# Patient Record
Sex: Male | Born: 1980 | Race: Black or African American | Hispanic: No | Marital: Single | Smoking: Current every day smoker
Health system: Southern US, Community
[De-identification: ages and names within clinical notes are randomized; demographics above are authoritative.]

## PROBLEM LIST (undated history)

## (undated) DIAGNOSIS — W3400XA Accidental discharge from unspecified firearms or gun, initial encounter: Secondary | ICD-10-CM

---

## 2019-12-19 ENCOUNTER — Emergency Department (HOSPITAL_COMMUNITY): Payer: Medicaid Other

## 2019-12-19 ENCOUNTER — Observation Stay (HOSPITAL_COMMUNITY): Payer: Medicaid Other

## 2019-12-19 ENCOUNTER — Encounter (HOSPITAL_COMMUNITY): Payer: Self-pay | Admitting: *Deleted

## 2019-12-19 ENCOUNTER — Observation Stay (HOSPITAL_COMMUNITY): Payer: Medicaid Other | Admitting: Certified Registered"

## 2019-12-19 ENCOUNTER — Other Ambulatory Visit: Payer: Self-pay

## 2019-12-19 ENCOUNTER — Encounter (HOSPITAL_COMMUNITY): Admission: EM | Disposition: A | Discharge status: Home / Self Care | Payer: Self-pay | Source: Home / Self Care

## 2019-12-19 ENCOUNTER — Inpatient Hospital Stay (HOSPITAL_COMMUNITY)
Admission: EM | Admit: 2019-12-19 | Discharge: 2019-12-21 | DRG: 481 | Disposition: A | Discharge status: Home / Self Care | Payer: Medicaid Other | Attending: Physician Assistant | Admitting: Physician Assistant

## 2019-12-19 DIAGNOSIS — S0183XA Puncture wound without foreign body of other part of head, initial encounter: Secondary | ICD-10-CM | POA: Diagnosis present

## 2019-12-19 DIAGNOSIS — W3400XA Accidental discharge from unspecified firearms or gun, initial encounter: Secondary | ICD-10-CM

## 2019-12-19 DIAGNOSIS — S62307B Unspecified fracture of fifth metacarpal bone, left hand, initial encounter for open fracture: Secondary | ICD-10-CM | POA: Diagnosis present

## 2019-12-19 DIAGNOSIS — D62 Acute posthemorrhagic anemia: Secondary | ICD-10-CM | POA: Diagnosis present

## 2019-12-19 DIAGNOSIS — T148XXA Other injury of unspecified body region, initial encounter: Secondary | ICD-10-CM

## 2019-12-19 DIAGNOSIS — Z20822 Contact with and (suspected) exposure to covid-19: Secondary | ICD-10-CM | POA: Diagnosis present

## 2019-12-19 DIAGNOSIS — S81041A Puncture wound with foreign body, right knee, initial encounter: Secondary | ICD-10-CM | POA: Diagnosis present

## 2019-12-19 DIAGNOSIS — M62838 Other muscle spasm: Secondary | ICD-10-CM | POA: Diagnosis not present

## 2019-12-19 DIAGNOSIS — S72431B Displaced fracture of medial condyle of right femur, initial encounter for open fracture type I or II: Principal | ICD-10-CM | POA: Diagnosis present

## 2019-12-19 HISTORY — DX: Accidental discharge from unspecified firearms or gun, initial encounter: W34.00XA

## 2019-12-19 HISTORY — PX: ORIF FEMUR FRACTURE: SHX2119

## 2019-12-19 HISTORY — PX: IRRIGATION AND DEBRIDEMENT KNEE: SHX5185

## 2019-12-19 HISTORY — PX: OPEN REDUCTION INTERNAL FIXATION (ORIF) METACARPAL: SHX6234

## 2019-12-19 LAB — CBC
HCT: 42.3 % (ref 39.0–52.0)
Hemoglobin: 13.6 g/dL (ref 13.0–17.0)
MCH: 27.1 pg (ref 26.0–34.0)
MCHC: 32.2 g/dL (ref 30.0–36.0)
MCV: 84.3 fL (ref 80.0–100.0)
Platelets: 188 10*3/uL (ref 150–400)
RBC: 5.02 MIL/uL (ref 4.22–5.81)
RDW: 13.2 % (ref 11.5–15.5)
WBC: 8.6 10*3/uL (ref 4.0–10.5)
nRBC: 0 % (ref 0.0–0.2)

## 2019-12-19 LAB — I-STAT CHEM 8, ED
BUN: 14 mg/dL (ref 6–20)
Calcium, Ion: 1.11 mmol/L — ABNORMAL LOW (ref 1.15–1.40)
Chloride: 103 mmol/L (ref 98–111)
Creatinine, Ser: 1 mg/dL (ref 0.61–1.24)
Glucose, Bld: 156 mg/dL — ABNORMAL HIGH (ref 70–99)
HCT: 40 % (ref 39.0–52.0)
Hemoglobin: 13.6 g/dL (ref 13.0–17.0)
Potassium: 4.1 mmol/L (ref 3.5–5.1)
Sodium: 138 mmol/L (ref 135–145)
TCO2: 27 mmol/L (ref 22–32)

## 2019-12-19 LAB — COMPREHENSIVE METABOLIC PANEL
ALT: 19 U/L (ref 0–44)
AST: 29 U/L (ref 15–41)
Albumin: 3.7 g/dL (ref 3.5–5.0)
Alkaline Phosphatase: 60 U/L (ref 38–126)
Anion gap: 10 (ref 5–15)
BUN: 13 mg/dL (ref 6–20)
CO2: 25 mmol/L (ref 22–32)
Calcium: 8.9 mg/dL (ref 8.9–10.3)
Chloride: 101 mmol/L (ref 98–111)
Creatinine, Ser: 1.11 mg/dL (ref 0.61–1.24)
GFR calc Af Amer: 46 mL/min — ABNORMAL LOW (ref >60)
GFR calc non Af Amer: 40 mL/min — ABNORMAL LOW (ref >60)
Glucose, Bld: 163 mg/dL — ABNORMAL HIGH (ref 70–99)
Potassium: 4 mmol/L (ref 3.5–5.1)
Sodium: 136 mmol/L (ref 135–145)
Total Bilirubin: 0.3 mg/dL (ref 0.3–1.2)
Total Protein: 6.9 g/dL (ref 6.5–8.1)

## 2019-12-19 LAB — SAMPLE TO BLOOD BANK

## 2019-12-19 LAB — ETHANOL: Alcohol, Ethyl (B): <10 mg/dL (ref <10)

## 2019-12-19 LAB — VITAMIN D 25 HYDROXY (VIT D DEFICIENCY, FRACTURES): Vit D, 25-Hydroxy: 13.51 ng/mL — ABNORMAL LOW (ref 30–100)

## 2019-12-19 LAB — LACTIC ACID, PLASMA: Lactic Acid, Venous: 1.9 mmol/L (ref 0.5–1.9)

## 2019-12-19 LAB — RESPIRATORY PANEL BY RT PCR (FLU A&B, COVID)
Influenza A by PCR: NEGATIVE
Influenza B by PCR: NEGATIVE
SARS Coronavirus 2 by RT PCR: NEGATIVE

## 2019-12-19 LAB — PROTIME-INR
INR: 1 (ref 0.8–1.2)
Prothrombin Time: 13 seconds (ref 11.4–15.2)

## 2019-12-19 LAB — CDS SEROLOGY

## 2019-12-19 SURGERY — IRRIGATION AND DEBRIDEMENT KNEE
Anesthesia: General | Site: Knee | Laterality: Right

## 2019-12-19 MED ORDER — METHOCARBAMOL 500 MG PO TABS
500.0000 mg | ORAL_TABLET | Freq: Four times a day (QID) | ORAL | Status: DC | PRN
  Administered 2019-12-19 – 2019-12-21 (×4): 500 mg via ORAL
  Filled 2019-12-19 (×4): qty 1

## 2019-12-19 MED ORDER — SODIUM CHLORIDE 0.9 % IV SOLN: INTRAVENOUS | Status: DC

## 2019-12-19 MED ORDER — OXYCODONE HCL 5 MG PO TABS: 5.0000 mg | ORAL_TABLET | ORAL | Status: DC | PRN

## 2019-12-19 MED ORDER — ACETAMINOPHEN 325 MG PO TABS: 325.0000 mg | ORAL_TABLET | Freq: Once | ORAL | Status: DC | PRN

## 2019-12-19 MED ORDER — OXYCODONE HCL 5 MG PO TABS
10.0000 mg | ORAL_TABLET | ORAL | Status: DC | PRN
  Administered 2019-12-19: 10 mg via ORAL
  Administered 2019-12-21: 15 mg via ORAL
  Filled 2019-12-19: qty 3

## 2019-12-19 MED ORDER — TETANUS-DIPHTH-ACELL PERTUSSIS 5-2.5-18.5 LF-MCG/0.5 IM SUSP
0.5000 mL | Freq: Once | INTRAMUSCULAR | Status: AC
  Administered 2019-12-19: 0.5 mL via INTRAMUSCULAR
  Filled 2019-12-19: qty 0.5

## 2019-12-19 MED ORDER — VANCOMYCIN HCL 1000 MG IV SOLR
INTRAVENOUS | Status: AC
  Filled 2019-12-19: qty 1000

## 2019-12-19 MED ORDER — FENTANYL CITRATE (PF) 250 MCG/5ML IJ SOLN
INTRAMUSCULAR | Status: DC | PRN
  Administered 2019-12-19 (×3): 50 ug via INTRAVENOUS
  Administered 2019-12-19: 25 ug via INTRAVENOUS
  Administered 2019-12-19: 50 ug via INTRAVENOUS
  Administered 2019-12-19: 150 ug via INTRAVENOUS

## 2019-12-19 MED ORDER — SUCCINYLCHOLINE CHLORIDE 200 MG/10ML IV SOSY
PREFILLED_SYRINGE | INTRAVENOUS | Status: DC | PRN
  Administered 2019-12-19: 140 mg via INTRAVENOUS

## 2019-12-19 MED ORDER — LACTATED RINGERS IV SOLN: INTRAVENOUS | Status: DC

## 2019-12-19 MED ORDER — ONDANSETRON 4 MG PO TBDP: 4.0000 mg | ORAL_TABLET | Freq: Four times a day (QID) | ORAL | Status: DC | PRN

## 2019-12-19 MED ORDER — ROCURONIUM BROMIDE 50 MG/5ML IV SOSY
PREFILLED_SYRINGE | INTRAVENOUS | Status: DC | PRN
  Administered 2019-12-19: 50 mg via INTRAVENOUS
  Administered 2019-12-19: 20 mg via INTRAVENOUS

## 2019-12-19 MED ORDER — MORPHINE SULFATE (PF) 2 MG/ML IV SOLN: 2.0000 mg | INTRAVENOUS | Status: DC | PRN

## 2019-12-19 MED ORDER — SODIUM CHLORIDE 0.9 % IR SOLN
Status: DC | PRN
  Administered 2019-12-19: 3000 mL

## 2019-12-19 MED ORDER — ONDANSETRON HCL 4 MG/2ML IJ SOLN: 4.0000 mg | Freq: Four times a day (QID) | INTRAMUSCULAR | Status: DC | PRN

## 2019-12-19 MED ORDER — ALBUMIN HUMAN 5 % IV SOLN: INTRAVENOUS | Status: DC | PRN

## 2019-12-19 MED ORDER — OXYCODONE HCL 5 MG PO TABS
5.0000 mg | ORAL_TABLET | ORAL | Status: DC | PRN
  Administered 2019-12-20 – 2019-12-21 (×5): 10 mg via ORAL
  Filled 2019-12-19 (×3): qty 2
  Filled 2019-12-19: qty 1
  Filled 2019-12-19 (×3): qty 2

## 2019-12-19 MED ORDER — VANCOMYCIN HCL 1000 MG IV SOLR
INTRAVENOUS | Status: DC | PRN
  Administered 2019-12-19: 1000 mg via TOPICAL

## 2019-12-19 MED ORDER — ACETAMINOPHEN 10 MG/ML IV SOLN
1000.0000 mg | Freq: Once | INTRAVENOUS | Status: DC | PRN
  Administered 2019-12-19: 1000 mg via INTRAVENOUS

## 2019-12-19 MED ORDER — CEFAZOLIN SODIUM-DEXTROSE 1-4 GM/50ML-% IV SOLN: 1.0000 g | Freq: Three times a day (TID) | INTRAVENOUS | Status: DC

## 2019-12-19 MED ORDER — 0.9 % SODIUM CHLORIDE (POUR BTL) OPTIME
TOPICAL | Status: DC | PRN
  Administered 2019-12-19: 1000 mL

## 2019-12-19 MED ORDER — FENTANYL CITRATE (PF) 250 MCG/5ML IJ SOLN
INTRAMUSCULAR | Status: AC
  Filled 2019-12-19: qty 5

## 2019-12-19 MED ORDER — CEFAZOLIN SODIUM-DEXTROSE 1-4 GM/50ML-% IV SOLN
INTRAVENOUS | Status: AC
  Filled 2019-12-19: qty 50

## 2019-12-19 MED ORDER — PROPOFOL 10 MG/ML IV BOLUS
INTRAVENOUS | Status: DC | PRN
  Administered 2019-12-19: 30 mg via INTRAVENOUS
  Administered 2019-12-19: 140 mg via INTRAVENOUS

## 2019-12-19 MED ORDER — HYDROMORPHONE HCL 1 MG/ML IJ SOLN: 0.2500 mg | INTRAMUSCULAR | Status: DC | PRN

## 2019-12-19 MED ORDER — ACETAMINOPHEN 10 MG/ML IV SOLN
INTRAVENOUS | Status: AC
  Filled 2019-12-19: qty 100

## 2019-12-19 MED ORDER — ENOXAPARIN SODIUM 40 MG/0.4ML ~~LOC~~ SOLN
40.0000 mg | SUBCUTANEOUS | Status: DC
  Administered 2019-12-20 – 2019-12-21 (×2): 40 mg via SUBCUTANEOUS
  Filled 2019-12-19 (×2): qty 0.4

## 2019-12-19 MED ORDER — LIDOCAINE 2% (20 MG/ML) 5 ML SYRINGE
INTRAMUSCULAR | Status: DC | PRN
  Administered 2019-12-19: 60 mg via INTRAVENOUS

## 2019-12-19 MED ORDER — MEPERIDINE HCL 25 MG/ML IJ SOLN: 6.2500 mg | INTRAMUSCULAR | Status: DC | PRN

## 2019-12-19 MED ORDER — CEFAZOLIN SODIUM-DEXTROSE 2-4 GM/100ML-% IV SOLN
2.0000 g | Freq: Three times a day (TID) | INTRAVENOUS | Status: DC
  Administered 2019-12-19: 2 g via INTRAVENOUS
  Filled 2019-12-19: qty 100

## 2019-12-19 MED ORDER — DEXMEDETOMIDINE HCL 200 MCG/2ML IV SOLN
INTRAVENOUS | Status: DC | PRN
  Administered 2019-12-19: 8 ug via INTRAVENOUS

## 2019-12-19 MED ORDER — CEFAZOLIN SODIUM-DEXTROSE 2-4 GM/100ML-% IV SOLN
2.0000 g | Freq: Three times a day (TID) | INTRAVENOUS | Status: AC
  Administered 2019-12-19 – 2019-12-20 (×3): 2 g via INTRAVENOUS
  Filled 2019-12-19 (×3): qty 100

## 2019-12-19 MED ORDER — GABAPENTIN 100 MG PO CAPS
100.0000 mg | ORAL_CAPSULE | Freq: Three times a day (TID) | ORAL | Status: DC
  Administered 2019-12-19 – 2019-12-21 (×7): 100 mg via ORAL
  Filled 2019-12-19 (×7): qty 1

## 2019-12-19 MED ORDER — MORPHINE SULFATE (PF) 2 MG/ML IV SOLN
2.0000 mg | INTRAVENOUS | Status: DC | PRN
  Administered 2019-12-19: 2 mg via INTRAVENOUS
  Filled 2019-12-19: qty 1

## 2019-12-19 MED ORDER — MIDAZOLAM HCL 2 MG/2ML IJ SOLN
INTRAMUSCULAR | Status: AC
  Filled 2019-12-19: qty 2

## 2019-12-19 MED ORDER — ACETAMINOPHEN 325 MG PO TABS: 650.0000 mg | ORAL_TABLET | ORAL | Status: DC | PRN

## 2019-12-19 MED ORDER — ACETAMINOPHEN 160 MG/5ML PO SOLN: 325.0000 mg | Freq: Once | ORAL | Status: DC | PRN

## 2019-12-19 MED ORDER — DEXAMETHASONE SODIUM PHOSPHATE 10 MG/ML IJ SOLN
INTRAMUSCULAR | Status: DC | PRN
  Administered 2019-12-19: 10 mg via INTRAVENOUS

## 2019-12-19 MED ORDER — FENTANYL CITRATE (PF) 100 MCG/2ML IJ SOLN
100.0000 ug | Freq: Once | INTRAMUSCULAR | Status: AC
  Administered 2019-12-19: 100 ug via INTRAVENOUS
  Filled 2019-12-19: qty 2

## 2019-12-19 MED ORDER — ACETAMINOPHEN 325 MG PO TABS
650.0000 mg | ORAL_TABLET | Freq: Four times a day (QID) | ORAL | Status: DC
  Administered 2019-12-20 – 2019-12-21 (×8): 650 mg via ORAL
  Filled 2019-12-19 (×8): qty 2

## 2019-12-19 MED ORDER — MIDAZOLAM HCL 5 MG/5ML IJ SOLN
INTRAMUSCULAR | Status: DC | PRN
  Administered 2019-12-19: 2 mg via INTRAVENOUS

## 2019-12-19 MED ORDER — METOPROLOL TARTRATE 5 MG/5ML IV SOLN: 5.0000 mg | Freq: Four times a day (QID) | INTRAVENOUS | Status: DC | PRN

## 2019-12-19 MED ORDER — PROMETHAZINE HCL 25 MG/ML IJ SOLN: 6.2500 mg | INTRAMUSCULAR | Status: DC | PRN

## 2019-12-19 MED ORDER — CEFAZOLIN SODIUM-DEXTROSE 1-4 GM/50ML-% IV SOLN
1.0000 g | Freq: Once | INTRAVENOUS | Status: AC
  Administered 2019-12-19: 1 g via INTRAVENOUS
  Filled 2019-12-19: qty 50

## 2019-12-19 MED ORDER — CEFAZOLIN SODIUM-DEXTROSE 2-4 GM/100ML-% IV SOLN
2.0000 g | Freq: Once | INTRAVENOUS | Status: AC
  Administered 2019-12-19: 2 g via INTRAVENOUS
  Filled 2019-12-19: qty 100

## 2019-12-19 MED ORDER — ONDANSETRON HCL 4 MG/2ML IJ SOLN
INTRAMUSCULAR | Status: DC | PRN
  Administered 2019-12-19: 4 mg via INTRAVENOUS

## 2019-12-19 MED ORDER — SUGAMMADEX SODIUM 200 MG/2ML IV SOLN
INTRAVENOUS | Status: DC | PRN
  Administered 2019-12-19: 150 mg via INTRAVENOUS

## 2019-12-19 SURGICAL SUPPLY — 85 items
BIT DRILL 2.5 X LONG (BIT) ×3
BIT DRILL X LONG 2.5 (BIT) ×3 IMPLANT
BNDG ELASTIC 2X5.8 VLCR STR LF (GAUZE/BANDAGES/DRESSINGS) ×10 IMPLANT
BNDG ELASTIC 6X10 VLCR STRL LF (GAUZE/BANDAGES/DRESSINGS) ×5 IMPLANT
BNDG ESMARK 4X9 LF (GAUZE/BANDAGES/DRESSINGS) ×5 IMPLANT
BRUSH SCRUB EZ PLAIN DRY (MISCELLANEOUS) ×15 IMPLANT
CHLORAPREP W/TINT 26 (MISCELLANEOUS) ×10 IMPLANT
CORD BIPOLAR FORCEPS 12FT (ELECTRODE) ×5 IMPLANT
COVER SURGICAL LIGHT HANDLE (MISCELLANEOUS) ×5 IMPLANT
CUFF TOURN SGL QUICK 18X4 (TOURNIQUET CUFF) ×5 IMPLANT
DERMABOND ADVANCED (GAUZE/BANDAGES/DRESSINGS) ×2
DERMABOND ADVANCED .7 DNX12 (GAUZE/BANDAGES/DRESSINGS) ×3 IMPLANT
DRAPE C-ARM 42X72 X-RAY (DRAPES) ×5 IMPLANT
DRAPE HALF SHEET 40X57 (DRAPES) ×5 IMPLANT
DRAPE OEC MINIVIEW 54X84 (DRAPES) ×5 IMPLANT
DRAPE ORTHO SPLIT 77X108 STRL (DRAPES) ×4
DRAPE SURG 17X23 STRL (DRAPES) ×10 IMPLANT
DRAPE SURG ORHT 6 SPLT 77X108 (DRAPES) ×6 IMPLANT
DRAPE U-SHAPE 47X51 STRL (DRAPES) ×5 IMPLANT
DRILL BIT X LONG 2.5 (BIT) ×2
DRSG MEPILEX BORDER 4X12 (GAUZE/BANDAGES/DRESSINGS) ×5 IMPLANT
DRSG MEPILEX BORDER 4X4 (GAUZE/BANDAGES/DRESSINGS) ×10 IMPLANT
DRSG XEROFORM 1X8 (GAUZE/BANDAGES/DRESSINGS) ×5 IMPLANT
ELECT REM PT RETURN 9FT ADLT (ELECTROSURGICAL) ×5
ELECTRODE REM PT RTRN 9FT ADLT (ELECTROSURGICAL) ×3 IMPLANT
GAUZE SPONGE 4X4 12PLY STRL (GAUZE/BANDAGES/DRESSINGS) ×5 IMPLANT
GAUZE XEROFORM 1X8 LF (GAUZE/BANDAGES/DRESSINGS) ×5 IMPLANT
GLOVE BIO SURGEON STRL SZ 6.5 (GLOVE) ×12 IMPLANT
GLOVE BIO SURGEON STRL SZ7.5 (GLOVE) ×15 IMPLANT
GLOVE BIO SURGEONS STRL SZ 6.5 (GLOVE) ×3
GLOVE BIOGEL M 8.0 STRL (GLOVE) ×5 IMPLANT
GLOVE BIOGEL PI IND STRL 6.5 (GLOVE) ×3 IMPLANT
GLOVE BIOGEL PI IND STRL 7.5 (GLOVE) ×3 IMPLANT
GLOVE BIOGEL PI INDICATOR 6.5 (GLOVE) ×2
GLOVE BIOGEL PI INDICATOR 7.5 (GLOVE) ×2
GOWN STRL REUS W/ TWL LRG LVL3 (GOWN DISPOSABLE) ×9 IMPLANT
GOWN STRL REUS W/TWL LRG LVL3 (GOWN DISPOSABLE) ×6
K-WIRE (Wire) ×5 IMPLANT
K-WIRE DBL TROCAR .045X4 (WIRE) ×5
KIT BASIN OR (CUSTOM PROCEDURE TRAY) ×10 IMPLANT
KIT TURNOVER KIT B (KITS) ×5 IMPLANT
KWIRE DBL TROCAR .045X4 (WIRE) ×3 IMPLANT
MANIFOLD NEPTUNE II (INSTRUMENTS) ×5 IMPLANT
NS IRRIG 1000ML POUR BTL (IV SOLUTION) ×10 IMPLANT
PACK ORTHO EXTREMITY (CUSTOM PROCEDURE TRAY) ×5 IMPLANT
PACK TOTAL JOINT (CUSTOM PROCEDURE TRAY) ×5 IMPLANT
PAD ARMBOARD 7.5X6 YLW CONV (MISCELLANEOUS) ×10 IMPLANT
PAD CAST 3X4 CTTN HI CHSV (CAST SUPPLIES) ×3 IMPLANT
PADDING CAST ABS 4INX4YD NS (CAST SUPPLIES) ×2
PADDING CAST ABS COTTON 4X4 ST (CAST SUPPLIES) ×3 IMPLANT
PADDING CAST COTTON 3X4 STRL (CAST SUPPLIES) ×2
PADDING CAST COTTON 6X4 STRL (CAST SUPPLIES) ×5 IMPLANT
PLATE LCP 3.5 1/3 TUB 10HX117 (Plate) ×5 IMPLANT
SCREW CORT 3.5X46M SELF TAP (Screw) ×10 IMPLANT
SCREW CORTEX 3.5 38MM (Screw) ×2 IMPLANT
SCREW CORTEX 3.5 50MM (Screw) ×5 IMPLANT
SCREW CORTEX 3.5 60MM (Screw) ×5 IMPLANT
SCREW CORTEX 3.5 65MM (Screw) ×5 IMPLANT
SCREW CORTEX 3.5 70MM SELF TAP (Screw) ×5 IMPLANT
SCREW CORTEX 3.5X75MM (Screw) ×5 IMPLANT
SCREW LOCK CORT ST 3.5X38 (Screw) ×3 IMPLANT
SCREW LOCK T15 FT 38X3.5XST (Screw) ×3 IMPLANT
SCREW LOCKING 3.5X38 (Screw) ×2 IMPLANT
SCREW LOCKING 3.5X42 (Screw) ×5 IMPLANT
SPLINT FIBERGLASS 3X12 (CAST SUPPLIES) ×5 IMPLANT
SPONGE LAP 18X18 RF (DISPOSABLE) ×5 IMPLANT
SUT ETHILON 3 0 PS 1 (SUTURE) ×10 IMPLANT
SUT ETHILON 4 0 PS 2 18 (SUTURE) ×10 IMPLANT
SUT MNCRL AB 3-0 PS2 18 (SUTURE) ×10 IMPLANT
SUT MON AB 2-0 CT1 36 (SUTURE) ×5 IMPLANT
SUT VIC AB 0 CT1 27 (SUTURE) ×2
SUT VIC AB 0 CT1 27XBRD ANBCTR (SUTURE) ×3 IMPLANT
SUT VIC AB 2-0 CT1 27 (SUTURE) ×8
SUT VIC AB 2-0 CT1 TAPERPNT 27 (SUTURE) ×12 IMPLANT
SUT VIC AB 3-0 SH 27 (SUTURE) ×2
SUT VIC AB 3-0 SH 27X BRD (SUTURE) ×3 IMPLANT
SYR BULB 3OZ (MISCELLANEOUS) ×5 IMPLANT
SYSTEM HAND FX 2.5 (Miscellaneous) ×10 IMPLANT
TOWEL GREEN STERILE (TOWEL DISPOSABLE) ×10 IMPLANT
TOWEL GREEN STERILE FF (TOWEL DISPOSABLE) ×10 IMPLANT
TUBE CONNECTING 12'X1/4 (SUCTIONS) ×1
TUBE CONNECTING 12X1/4 (SUCTIONS) ×4 IMPLANT
UNDERPAD 30X30 (UNDERPADS AND DIAPERS) ×10 IMPLANT
WATER STERILE IRR 1000ML POUR (IV SOLUTION) ×15 IMPLANT
YANKAUER SUCT BULB TIP NO VENT (SUCTIONS) ×5 IMPLANT

## 2019-12-19 NOTE — Progress Notes (Signed)
PT Cancellation Note  Patient Details Name: Brian Goodwin MRN: [id number] DOB: February 05, 1981   Cancelled Treatment:     Patient to have surgery on his knee this afternoon. Therapy will follow up per surgeon recommendations.    Dessie Coma PT DPT  12/19/2019, 1:06 PM

## 2019-12-19 NOTE — Anesthesia Procedure Notes (Signed)
Procedure Name: Intubation Date/Time: 12/19/2019 1:58 PM Performed by: Griffin Dakin, CRNA Pre-anesthesia Checklist: Patient identified, Emergency Drugs available, Suction available and Patient being monitored Patient Re-evaluated:Patient Re-evaluated prior to induction Oxygen Delivery Method: Circle system utilized Preoxygenation: Pre-oxygenation with 100% oxygen Induction Type: IV induction and Rapid sequence Laryngoscope Size: Mac and 4 Grade View: Grade II Tube type: Oral Tube size: 7.5 mm Number of attempts: 1 Airway Equipment and Method: Stylet Placement Confirmation: ETT inserted through vocal cords under direct vision,  positive ETCO2 and breath sounds checked- equal and bilateral Secured at: 22 cm Tube secured with: Tape Dental Injury: Teeth and Oropharynx as per pre-operative assessment
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
## 2019-12-19 NOTE — ED Notes (Signed)
Pt clothing placed in paper bag and closed with pt labels. Pt wallet and shoes placed in belongings bag. Pt asking about his cell phone and charger, pt did not arrive with that. PD has not arrived to ED prior to transfer to the floor.

## 2019-12-19 NOTE — Progress Notes (Signed)
Orthopedic Tech Progress Note Patient Details:  Brian Goodwin 10/22/1876 [id number]  Ortho Devices Type of Ortho Device: Knee Immobilizer, Ulna gutter splint Ortho Device/Splint Location: lue ulna gutter, rle knee immobilizer. applied at drs request. Ortho Device/Splint Interventions: Ordered, Application, Adjustment   Post Interventions Patient Tolerated: Well Instructions Provided: Care of device, Adjustment of device   [name] 12/19/2019, 2:45 AM
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
## 2019-12-19 NOTE — Plan of Care (Signed)
  Problem: Pain Managment: Goal: General experience of comfort will improve Outcome: Progressing   Problem: Safety: Goal: Ability to remain free from injury will improve Outcome: Progressing   

## 2019-12-19 NOTE — Anesthesia Preprocedure Evaluation (Signed)
Anesthesia Evaluation  Patient identified by MRN, date of birth, ID band Patient awake    Reviewed: Allergy & Precautions, NPO status , Patient's Chart, lab work & pertinent test results  Airway Mallampati: II  TM Distance: >3 FB Neck ROM: Full    Dental  (+) Teeth Intact, Dental Advisory Given   Pulmonary neg pulmonary ROS,    breath sounds clear to auscultation       Cardiovascular negative cardio ROS   Rhythm:Regular Rate:Normal     Neuro/Psych negative neurological ROS  negative psych ROS   GI/Hepatic negative GI ROS, Neg liver ROS,   Endo/Other  negative endocrine ROS  Renal/GU negative Renal ROS  negative genitourinary   Musculoskeletal negative musculoskeletal ROS (+)   Abdominal Normal abdominal exam  (+)   Peds  Hematology negative hematology ROS (+)   Anesthesia Other Findings   Reproductive/Obstetrics                             Anesthesia Physical Anesthesia Plan  ASA: II  Anesthesia Plan: General   Post-op Pain Management:    Induction: Intravenous, Rapid sequence and Cricoid pressure planned  PONV Risk Score and Plan: 3 and Ondansetron, Midazolam and Dexamethasone  Airway Management Planned: Oral ETT  Additional Equipment: None  Intra-op Plan:   Post-operative Plan: Extubation in OR  Informed Consent: I have reviewed the patients History and Physical, chart, labs and discussed the procedure including the risks, benefits and alternatives for the proposed anesthesia with the patient or authorized representative who has indicated his/her understanding and acceptance.     Dental advisory given  Plan Discussed with: CRNA  Anesthesia Plan Comments:         Anesthesia Quick Evaluation
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
## 2019-12-19 NOTE — Progress Notes (Signed)
S/p ORIF L 5th metacarpal.  Keep L hand elevated, nwb, keep splint c/d/i.  Pt may f/u in my office in 2wks.

## 2019-12-19 NOTE — Progress Notes (Signed)
OT Cancellation Note  Patient Details Name: Brian Goodwin MRN: [id number] DOB: 1981/02/05   Cancelled Treatment:     Pt for surgery later today--will defer OT eval until later date.  [facility]  OTR/L Acute [employer] Pager [phone] Office [phone]     12/19/2019, 8:36 AM
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
## 2019-12-19 NOTE — Progress Notes (Signed)
Patient transported to OR via bed by orderly at 1245 on 12/19/19.

## 2019-12-20 DIAGNOSIS — S81041A Puncture wound with foreign body, right knee, initial encounter: Secondary | ICD-10-CM | POA: Diagnosis present

## 2019-12-20 DIAGNOSIS — S62307B Unspecified fracture of fifth metacarpal bone, left hand, initial encounter for open fracture: Secondary | ICD-10-CM | POA: Diagnosis present

## 2019-12-20 DIAGNOSIS — Z20822 Contact with and (suspected) exposure to covid-19: Secondary | ICD-10-CM | POA: Diagnosis present

## 2019-12-20 DIAGNOSIS — S0183XA Puncture wound without foreign body of other part of head, initial encounter: Secondary | ICD-10-CM | POA: Diagnosis present

## 2019-12-20 DIAGNOSIS — M62838 Other muscle spasm: Secondary | ICD-10-CM | POA: Diagnosis not present

## 2019-12-20 DIAGNOSIS — S72431B Displaced fracture of medial condyle of right femur, initial encounter for open fracture type I or II: Secondary | ICD-10-CM | POA: Diagnosis present

## 2019-12-20 DIAGNOSIS — W3400XA Accidental discharge from unspecified firearms or gun, initial encounter: Secondary | ICD-10-CM | POA: Diagnosis not present

## 2019-12-20 DIAGNOSIS — D62 Acute posthemorrhagic anemia: Secondary | ICD-10-CM | POA: Diagnosis present

## 2019-12-20 LAB — CBC
HCT: 30.7 % — ABNORMAL LOW (ref 39.0–52.0)
Hemoglobin: 10.1 g/dL — ABNORMAL LOW (ref 13.0–17.0)
MCH: 27.2 pg (ref 26.0–34.0)
MCHC: 32.9 g/dL (ref 30.0–36.0)
MCV: 82.5 fL (ref 80.0–100.0)
Platelets: 161 10*3/uL (ref 150–400)
RBC: 3.72 MIL/uL — ABNORMAL LOW (ref 4.22–5.81)
RDW: 13.2 % (ref 11.5–15.5)
WBC: 10 10*3/uL (ref 4.0–10.5)
nRBC: 0 % (ref 0.0–0.2)

## 2019-12-20 MED ORDER — VITAMIN D (ERGOCALCIFEROL) 1.25 MG (50000 UNIT) PO CAPS
50000.0000 [IU] | ORAL_CAPSULE | ORAL | Status: DC
  Administered 2019-12-20: 50000 [IU] via ORAL
  Filled 2019-12-20: qty 1

## 2019-12-20 MED ORDER — VITAMIN D 25 MCG (1000 UNIT) PO TABS
2000.0000 [IU] | ORAL_TABLET | Freq: Two times a day (BID) | ORAL | Status: DC
  Administered 2019-12-20 – 2019-12-21 (×3): 2000 [IU] via ORAL
  Filled 2019-12-20 (×3): qty 2
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
## 2019-12-20 NOTE — Discharge Instructions (Signed)
[facility] Discharge Instructions   General Discharge Instructions  WEIGHT BEARING STATUS: Nonweightbearing right lower extremity.  Nonweightbearing through left hand  RANGE OF MOTION/ACTIVITY: Okay for unrestricted range of motion of right knee. Keep L hand elevated, keep dressing in place, clean, dry , no lifting with left hand  Wound Care: Incisions on right leg can be left open to air if there is no drainage. If incision continues to have drainage, follow wound care instructions below. Okay to shower if no drainage from incisions.  Keep splint on left hand clean and dry  DVT/PE prophylaxis: Aspirin 325 mg twice daily x30 days  Diet: as you were eating previously.  Can use over the counter stool softeners and bowel preparations, such as Miralax, to help with bowel movements.  Narcotics can be constipating.  Be sure to drink plenty of fluids  PAIN MEDICATION USE AND EXPECTATIONS  You have likely been given narcotic medications to help control your pain.  After a traumatic event that results in an fracture (broken bone) with or without surgery, it is ok to use narcotic pain medications to help control one's pain.  We understand that everyone responds to pain differently and each individual patient will be evaluated on a regular basis for the continued need for narcotic medications. Ideally, narcotic medication use should last no more than 6-8 weeks (coinciding with fracture healing).   As a patient it is your responsibility as well to monitor narcotic medication use and report the amount and frequency you use these medications when you come to your office visit.   We would also advise that if you are using narcotic medications, you should take a dose prior to therapy to maximize you participation.  IF YOU ARE ON NARCOTIC MEDICATIONS IT IS NOT PERMISSIBLE TO OPERATE A MOTOR VEHICLE (MOTORCYCLE/CAR/TRUCK/MOPED) OR HEAVY MACHINERY DO NOT MIX NARCOTICS WITH OTHER CNS (CENTRAL  NERVOUS SYSTEM) DEPRESSANTS SUCH AS ALCOHOL   STOP SMOKING OR USING NICOTINE PRODUCTS!!!!  As discussed nicotine severely impairs your body's ability to heal surgical and traumatic wounds but also impairs bone healing.  Wounds and bone heal by forming microscopic blood vessels (angiogenesis) and nicotine is a vasoconstrictor (essentially, shrinks blood vessels).  Therefore, if vasoconstriction occurs to these microscopic blood vessels they essentially disappear and are unable to deliver necessary nutrients to the healing tissue.  This is one modifiable factor that you can do to dramatically increase your chances of healing your injury.    (This means no smoking, no nicotine gum, patches, etc)  DO NOT USE NONSTEROIDAL ANTI-INFLAMMATORY DRUGS (NSAID'S)  Using products such as Advil (ibuprofen), Aleve (naproxen), Motrin (ibuprofen) for additional pain control during fracture healing can delay and/or prevent the healing response.  If you would like to take over the counter (OTC) medication, Tylenol (acetaminophen) is ok.  However, some narcotic medications that are given for pain control contain acetaminophen as well. Therefore, you should not exceed more than 4000 mg of tylenol in a day if you do not have liver disease.  Also note that there are may OTC medicines, such as cold medicines and allergy medicines that my contain tylenol as well.  If you have any questions about medications and/or interactions please ask your doctor/PA or your pharmacist.      ICE AND ELEVATE INJURED/OPERATIVE EXTREMITY  Using ice and elevating the injured extremity above your heart can help with swelling and pain control.  Icing in a pulsatile fashion, such as 20 minutes on and 20 minutes  off, can be followed.    Do not place ice directly on skin. Make sure there is a barrier between to skin and the ice pack.    Using frozen items such as frozen peas works well as the conform nicely to the are that needs to be iced.  USE AN  ACE WRAP OR TED HOSE FOR SWELLING CONTROL  In addition to icing and elevation, Ace wraps or TED hose are used to help limit and resolve swelling.  It is recommended to use Ace wraps or TED hose until you are informed to stop.    When using Ace Wraps start the wrapping distally (farthest away from the body) and wrap proximally (closer to the body)   Example: If you had surgery on your leg or thing and you do not have a splint on, start the ace wrap at the toes and work your way up to the thigh        If you had surgery on your upper extremity and do not have a splint on, start the ace wrap at your fingers and work your way up to the upper arm  IF YOU ARE IN A SPLINT OR CAST DO NOT REMOVE IT FOR ANY REASON   If your splint gets wet for any reason please contact the office immediately. You may shower in your splint or cast as long as you keep it dry.  This can be done by wrapping in a cast cover or garbage back (or similar)  Do Not stick any thing down your splint or cast such as pencils, money, or hangers to try and scratch yourself with.  If you feel itchy take benadryl as prescribed on the bottle for itching   CALL THE OFFICE WITH ANY QUESTIONS OR CONCERNS: [phone]   VISIT OUR WEBSITE FOR ADDITIONAL INFORMATION: [email or web address]    Discharge Wound Care Instructions  Do NOT apply any ointments, solutions or lotions to pin sites or surgical wounds.  These prevent needed drainage and even though solutions like hydrogen peroxide kill bacteria, they also damage cells lining the pin sites that help fight infection.  Applying lotions or ointments can keep the wounds moist and can cause them to breakdown and open up as well. This can increase the risk for infection. When in doubt call the office.  Surgical incisions should be dressed daily.  If any drainage is noted, use one layer of adaptic, then gauze, Kerlix, and an ace wrap.  Once the incision is completely dry and without drainage, it may  be left open to air out.  Showering may begin 36-48 hours later.  Cleaning gently with soap and water.  Traumatic wounds should be dressed daily as well.    One layer of adaptic, gauze, Kerlix, then ace wrap.  The adaptic can be discontinued once the draining has ceased    If you have a wet to dry dressing: wet the gauze with saline the squeeze as much saline out so the gauze is moist (not soaking wet), place moistened gauze over wound, then place a dry gauze over the moist one, followed by Kerlix wrap, then ace wrap.

## 2019-12-20 NOTE — Plan of Care (Signed)
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
## 2019-12-20 NOTE — Plan of Care (Signed)
  Problem: Coping: Goal: Level of anxiety will decrease Outcome: Progressing   Problem: Pain Managment: Goal: General experience of comfort will improve Outcome: Progressing   Problem: Safety: Goal: Ability to remain free from injury will improve Outcome: Progressing   Problem: Skin Integrity: Goal: Risk for impaired skin integrity will decrease Outcome: Progressing   
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
## 2019-12-21 ENCOUNTER — Encounter (HOSPITAL_COMMUNITY): Payer: Self-pay

## 2019-12-21 MED ORDER — POLYETHYLENE GLYCOL 3350 17 G PO PACK: 17.0000 g | PACK | Freq: Every day | ORAL | 0 refills | Status: AC | PRN

## 2019-12-21 MED ORDER — OXYCODONE HCL 10 MG PO TABS: 5.0000 mg | ORAL_TABLET | Freq: Four times a day (QID) | ORAL | 0 refills | Status: AC | PRN

## 2019-12-21 MED ORDER — VITAMIN D (ERGOCALCIFEROL) 1.25 MG (50000 UNIT) PO CAPS: 50000.0000 [IU] | ORAL_CAPSULE | ORAL | 0 refills | Status: AC

## 2019-12-21 MED ORDER — VITAMIN D3 25 MCG PO TABS: 2000.0000 [IU] | ORAL_TABLET | Freq: Two times a day (BID) | ORAL | 0 refills | Status: AC

## 2019-12-21 MED ORDER — ACETAMINOPHEN 325 MG PO TABS: 650.0000 mg | ORAL_TABLET | Freq: Four times a day (QID) | ORAL | Status: AC | PRN

## 2019-12-21 MED ORDER — MORPHINE SULFATE (PF) 2 MG/ML IV SOLN: 2.0000 mg | INTRAVENOUS | Status: DC | PRN

## 2019-12-21 MED ORDER — ASPIRIN EC 325 MG PO TBEC: 325.0000 mg | DELAYED_RELEASE_TABLET | Freq: Two times a day (BID) | ORAL | 0 refills | Status: AC

## 2019-12-21 MED ORDER — ASPIRIN EC 325 MG PO TBEC: 325.0000 mg | DELAYED_RELEASE_TABLET | Freq: Two times a day (BID) | ORAL | 0 refills | Status: DC

## 2019-12-21 MED ORDER — POLYETHYLENE GLYCOL 3350 17 G PO PACK
17.0000 g | PACK | Freq: Every day | ORAL | Status: DC
  Administered 2019-12-21: 17 g via ORAL
  Filled 2019-12-21: qty 1

## 2019-12-21 MED ORDER — DOCUSATE SODIUM 100 MG PO CAPS
100.0000 mg | ORAL_CAPSULE | Freq: Two times a day (BID) | ORAL | Status: DC
  Administered 2019-12-21: 100 mg via ORAL
  Filled 2019-12-21: qty 1

## 2019-12-21 MED FILL — VIT D2 1.25 MG (50,000 UNIT: 1.25 MG | 30 days supply | Qty: 5 | Fill #0

## 2019-12-21 MED FILL — oxyCODONE HCL 10 MG TABS: 10 | 7 days supply | Qty: 25 | Fill #0

## 2019-12-21 MED FILL — ASPIRIN EC 325 MG TABLET: 325 | 30 days supply | Qty: 60 | Fill #0
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
## 2019-12-21 NOTE — Plan of Care (Signed)
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
## 2019-12-21 NOTE — Progress Notes (Signed)
Provided discharge education/instructions, all questions and concerns addressed, Pt not in distress. DME and meds from [facility] pharmacy delivered to room. Pt to discharge home with belongings.

## 2019-12-23 ENCOUNTER — Encounter: Payer: Self-pay | Admitting: *Deleted

## 2019-12-23 ENCOUNTER — Encounter: Payer: Self-pay | Admitting: Student

## 2019-12-23 DIAGNOSIS — S62337B Displaced fracture of neck of fifth metacarpal bone, left hand, initial encounter for open fracture: Secondary | ICD-10-CM | POA: Insufficient documentation

## 2019-12-23 DIAGNOSIS — S72411B Displaced unspecified condyle fracture of lower end of right femur, initial encounter for open fracture type I or II: Secondary | ICD-10-CM | POA: Insufficient documentation
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
## 2021-03-14 IMAGING — DX DG HAND 2V*L*
1 series · 2 of 2 positions shown · non-contrast
Comparison: 12/19/2019

CLINICAL DATA: Status post ORIF

EXAM:
LEFT HAND - 2 VIEW

[Series 1: hand · 0.14mm/px · 2 of 2 slices shown]
[im 1/2]
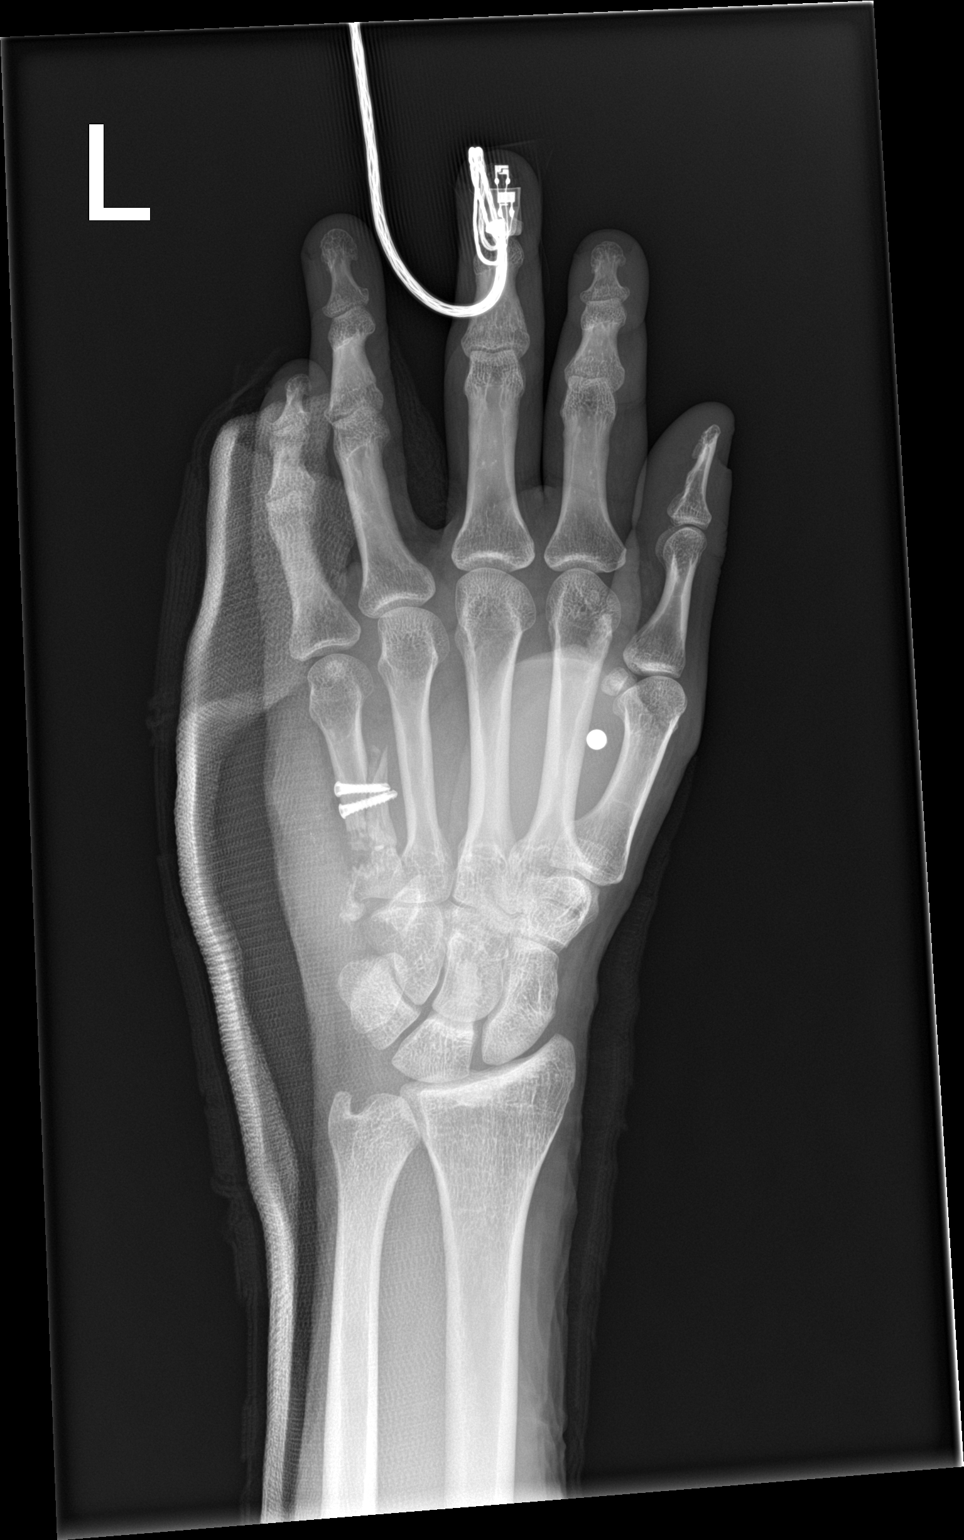
[im 2/2]
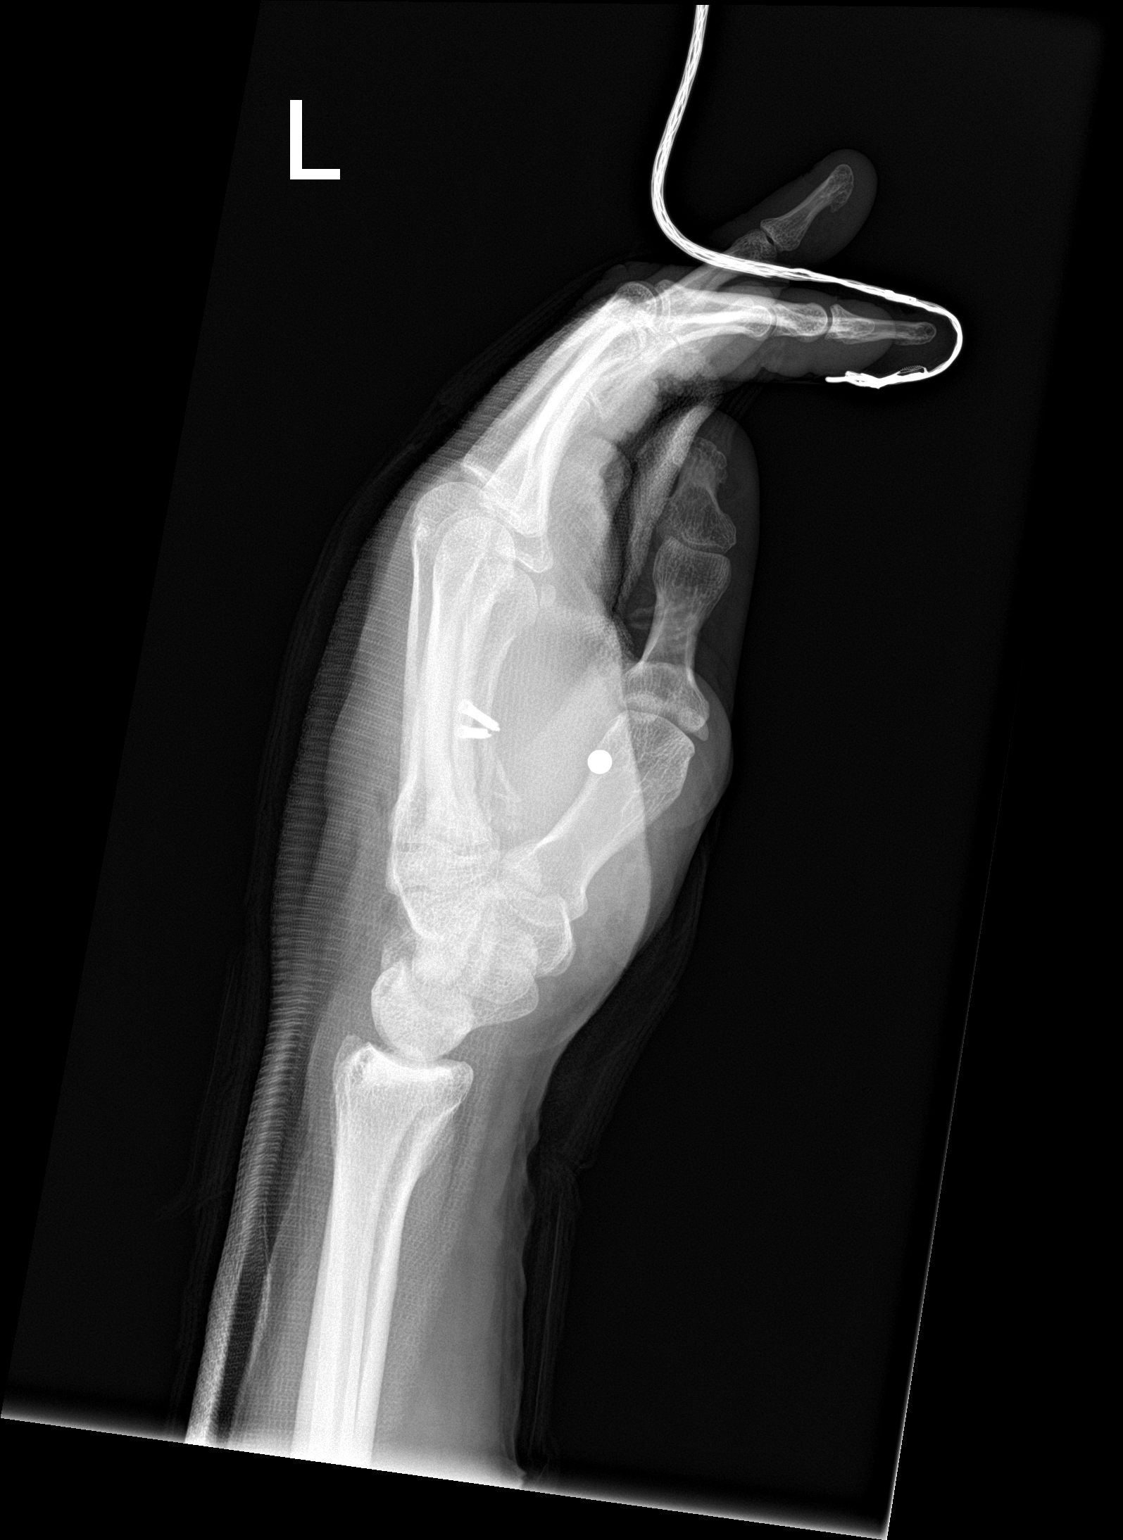

[2 of 2 positions shown; findings below may reference images not displayed]

FINDINGS: The patient has undergone ORIF of the metacarpal. Again noted is a
comminuted fracture of the fifth metacarpal. The osseous alignment
is stable. There is surrounding soft tissue swelling. There is an
overlying fiberglass cast. A metallic foreign body projects over the
first webspace.
IMPRESSION: Status post ORIF as above.
